# Patient Record
Sex: Female | Born: 1980 | Hispanic: No | Marital: Married | Smoking: Former smoker
Health system: Southern US, Community
[De-identification: ages and names within clinical notes are randomized; demographics above are authoritative.]

---

## 2012-06-06 ENCOUNTER — Emergency Department (HOSPITAL_BASED_OUTPATIENT_CLINIC_OR_DEPARTMENT_OTHER): Payer: Managed Care, Other (non HMO)

## 2012-06-06 ENCOUNTER — Encounter (HOSPITAL_BASED_OUTPATIENT_CLINIC_OR_DEPARTMENT_OTHER): Payer: Self-pay

## 2012-06-06 ENCOUNTER — Emergency Department (HOSPITAL_BASED_OUTPATIENT_CLINIC_OR_DEPARTMENT_OTHER)
Admission: EM | Admit: 2012-06-06 | Discharge: 2012-06-06 | Disposition: A | Discharge status: Home / Self Care | Payer: Managed Care, Other (non HMO) | Attending: Emergency Medicine | Admitting: Emergency Medicine

## 2012-06-06 DIAGNOSIS — Z331 Pregnant state, incidental: Secondary | ICD-10-CM

## 2012-06-06 DIAGNOSIS — M545 Low back pain, unspecified: Secondary | ICD-10-CM | POA: Insufficient documentation

## 2012-06-06 DIAGNOSIS — R079 Chest pain, unspecified: Secondary | ICD-10-CM | POA: Insufficient documentation

## 2012-06-06 DIAGNOSIS — O99891 Other specified diseases and conditions complicating pregnancy: Secondary | ICD-10-CM | POA: Insufficient documentation

## 2012-06-06 DIAGNOSIS — O26859 Spotting complicating pregnancy, unspecified trimester: Secondary | ICD-10-CM | POA: Insufficient documentation

## 2012-06-06 DIAGNOSIS — R109 Unspecified abdominal pain: Secondary | ICD-10-CM | POA: Insufficient documentation

## 2012-06-06 DIAGNOSIS — O210 Mild hyperemesis gravidarum: Secondary | ICD-10-CM | POA: Insufficient documentation

## 2012-06-06 LAB — CBC
Hemoglobin: 14.4 g/dL (ref 12.0–15.0)
MCH: 33.7 pg (ref 26.0–34.0)
MCHC: 36 g/dL (ref 30.0–36.0)
MCV: 93.7 fL (ref 78.0–100.0)
Platelets: 251 10*3/uL (ref 150–400)

## 2012-06-06 LAB — PREGNANCY, URINE: Preg Test, Ur: POSITIVE — AB

## 2012-06-06 LAB — URINALYSIS, ROUTINE W REFLEX MICROSCOPIC
Ketones, ur: NEGATIVE mg/dL
Leukocytes, UA: NEGATIVE
Protein, ur: NEGATIVE mg/dL
Urobilinogen, UA: 0.2 mg/dL (ref 0.0–1.0)

## 2012-06-06 LAB — ABO/RH: ABO/RH(D): B POS

## 2012-06-06 LAB — WET PREP, GENITAL: Trich, Wet Prep: NONE SEEN

## 2012-06-06 MED ORDER — PRENATAL COMPLETE 14-0.4 MG PO TABS: 1.0000 | ORAL_TABLET | Freq: Every day | ORAL | Status: AC

## 2012-06-06 NOTE — ED Notes (Signed)
C/o abd pain,lower back pain x 1 week pain to upper left back,left breast today

## 2012-06-06 NOTE — ED Notes (Addendum)
Pt states she started to have cramping in her abdomen last week and felt like she was going to start her period but nothing but spotting came. Cramping in genital area feels like when she had miscarriage in Dec 2012. Pt states today sharp pain starting in neck and left shoulder blade radiating to left breast almost caused her to leave work. Pt c/o lower back pain as well and numbness in legs. Pt c/o dizziness, diarrhea, and nausea. Pt denies vomiting and SOB.

## 2012-06-07 LAB — GC/CHLAMYDIA PROBE AMP, GENITAL
Chlamydia, DNA Probe: NEGATIVE
GC Probe Amp, Genital: NEGATIVE

## 2012-06-10 NOTE — ED Provider Notes (Signed)
History     CSN: [id number]  Arrival date & time 06/06/12  1735   First MD Initiated Contact with Patient 06/06/12 1809      Chief Complaint  Patient presents with  . Abdominal Pain    (Consider location/radiation/quality/duration/timing/severity/associated sxs/prior treatment) HPI Hx from pt. Sandra Case is a 31 y.o. female presenting with crampy lower abdominal pain x1 week and low back pain. She developed some vaginal bleeding/spotting today. She reports that her last menstrual period was July 4. Pain is described as cramping with no known aggravating or alleviating factors. It seems to radiate from the abdomen to the back. She has not taken any medication at home for this. Patient states that she was pregnant and had a spontaneous abortion in December of 2012 and her symptoms felt similar at that time. She has been nauseated. She denies any vomiting, fever, chills, changes in bowel movements, urinary symptoms. Patient states that today she had a sharp intermittent pain to the left shoulder blade which radiated to her left breast. It did not worsen with deep breaths. It was intermittent in nature. Worsens with movement. No shortness of breath associated with it.  History reviewed. No pertinent past medical history.  History reviewed. No pertinent past surgical history.  No family history on file.  History  Substance Use Topics  . Smoking status: Current Everyday Smoker  . Smokeless tobacco: Not on file  . Alcohol Use: Yes    OB History    Grav Para Term Preterm Abortions TAB SAB Ect Mult Living                  Review of Systems  Constitutional: Negative for fever, chills, activity change and appetite change.  Respiratory: Negative for cough and shortness of breath.   Cardiovascular: Positive for chest pain. Negative for palpitations and leg swelling.  Gastrointestinal: Positive for nausea and abdominal pain. Negative for vomiting, diarrhea, constipation and  abdominal distention.  Genitourinary: Positive for vaginal bleeding. Negative for dysuria and vaginal discharge.  Musculoskeletal: Negative for myalgias.  Skin: Negative for rash.  Neurological: Negative for dizziness, weakness and numbness.  All other systems reviewed and are negative.    Allergies  Review of patient's allergies indicates no known allergies.  Home Medications   Current Outpatient Rx  Name Route Sig Dispense Refill  . IBUPROFEN 200 MG PO TABS Oral Take 800 mg by mouth every 6 (six) hours as needed. For headache.    Marland Kitchen PRENATAL COMPLETE 14-0.4 MG PO TABS Oral Take 1 tablet by mouth daily. 60 each 4    BP 127/71  Pulse 85  Temp 98.6 F (37 C) (Oral)  Resp 16  Ht 5\' 7"  (1.702 m)  Wt 127 lb (57.607 kg)  BMI 19.89 kg/m2  SpO2 100%  LMP 04/26/2012  Physical Exam  Nursing note and vitals reviewed. Constitutional: She appears well-developed and well-nourished. No distress.  HENT:  Head: Normocephalic and atraumatic.  Neck: Normal range of motion.  Cardiovascular: Normal rate, regular rhythm and normal heart sounds.   Pulmonary/Chest: Effort normal and breath sounds normal. She exhibits tenderness.    Abdominal: Soft. Bowel sounds are normal.       Mild suprapubic tenderness, no rebound or guard. No CVA tenderness.  Genitourinary:       Chaperone present during exam. Cervical os is closed. No cervical motion tenderness. Small amount of brown appearing blood in the vaginal vault. No masses appreciated on bimanual exam.  Musculoskeletal: Normal range of motion.  Neurological: She is alert.  Skin: Skin is warm and dry. She is not diaphoretic.  Psychiatric: She has a normal mood and affect.    ED Course  Procedures (including critical care time)  Labs Reviewed  URINALYSIS, ROUTINE W REFLEX MICROSCOPIC - Abnormal; Notable for the following:    APPearance CLOUDY (*)     All other components within normal limits  PREGNANCY, URINE - Abnormal; Notable for the  following:    Preg Test, Ur POSITIVE (*)     All other components within normal limits  HCG, QUANTITATIVE, PREGNANCY - Abnormal; Notable for the following:    hCG, Beta Chain, Quant, S 6385 (*)     All other components within normal limits  CBC - Abnormal; Notable for the following:    RDW 11.4 (*)     All other components within normal limits  WET PREP, GENITAL - Abnormal; Notable for the following:    Clue Cells Wet Prep HPF POC FEW (*)     WBC, Wet Prep HPF POC FEW (*)     All other components within normal limits  ABO/RH  GC/CHLAMYDIA PROBE AMP, GENITAL   No results found.   1. Pregnant state, incidental       MDM  Patient presents with crampy abdominal pain and spotting. The spotting started today. Patient reports she has had a miscarriage previously. Last menstrual period was July 4. Urine hCG here is positive. Ultrasound shows intrauterine pregnancy with estimated gestation of approximately 5 weeks. On exam, the cervical os is closed. There is a small amount of blood seen. She is Rh+, so no need for RhoGAM. Lab investigation otherwise unremarkable.  Regarding the patient's chest/shoulder discomfort, her pain is not pleuritic in nature she denies any shortness of breath with this. Her vitals are stable. I do not suspect pulmonary embolus as the etiology for this as she does have reproducible pain on exam.  Patient was instructed to make a followup with OB/GYN regarding her positive pregnancy test. Prescription given for prenatal vitamins. Reasons to return to the emergency department discussed.        Grant Fontana, PA-C 06/10/12 1232

## 2012-06-10 NOTE — ED Provider Notes (Signed)
Medical screening examination/treatment/procedure(s) were performed by non-physician practitioner and as supervising physician I was immediately available for consultation/collaboration.   Charles B. Sheldon, MD 06/10/12 2152 

## 2013-01-21 ENCOUNTER — Inpatient Hospital Stay (HOSPITAL_COMMUNITY): Admission: AD | Admit: 2013-01-21 | Payer: Self-pay | Source: Ambulatory Visit | Admitting: Obstetrics and Gynecology

## 2013-02-06 IMAGING — US US OB COMP LESS 14 WK
1 series · 14 of 28 positions shown · non-contrast
Comparison: None.

CLINICAL DATA: ]  Abdominal cramping, bleeding, pregnant.

OBSTETRIC <14 WK US AND TRANSVAGINAL OB US
TECHNIQUE: Both transabdominal and transvaginal ultrasound
examinations were performed for complete evaluation of the
gestation as well as the maternal uterus, adnexal regions, and
pelvic cul-de-sac.  Transvaginal technique was performed to assess
early pregnancy.

[Series 1: us ob comp less 14 wk · 0.22mm/px · 14 of 67 slices shown]
[im 3/67]
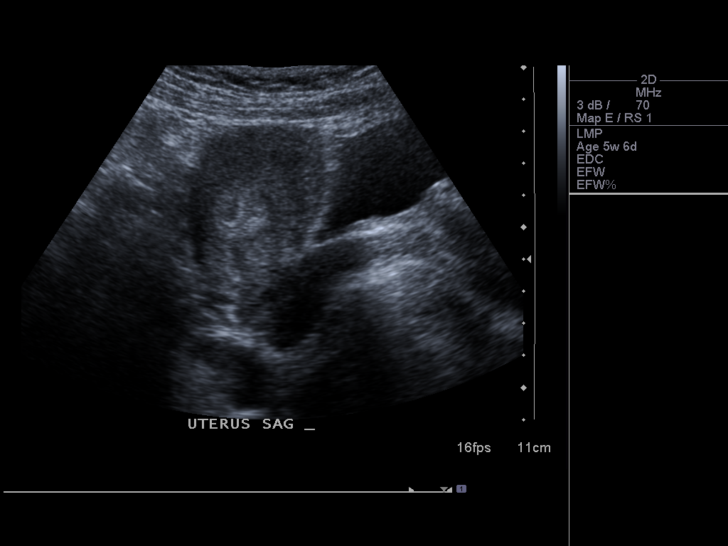
[im 8/67]
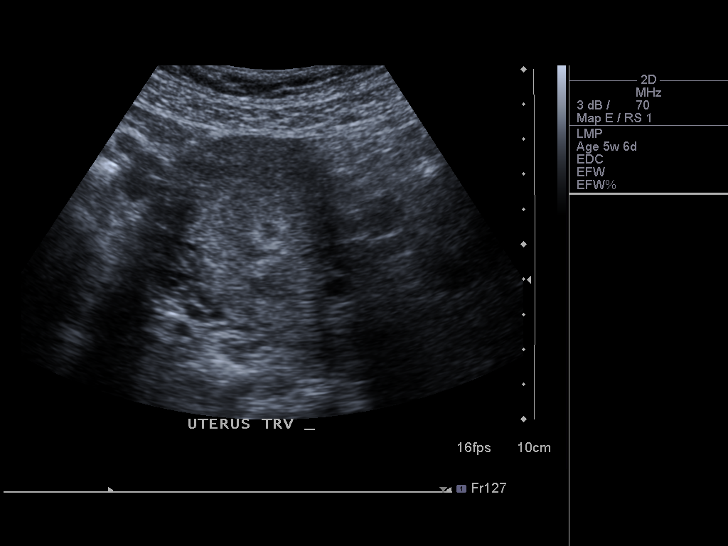
[im 13/67]
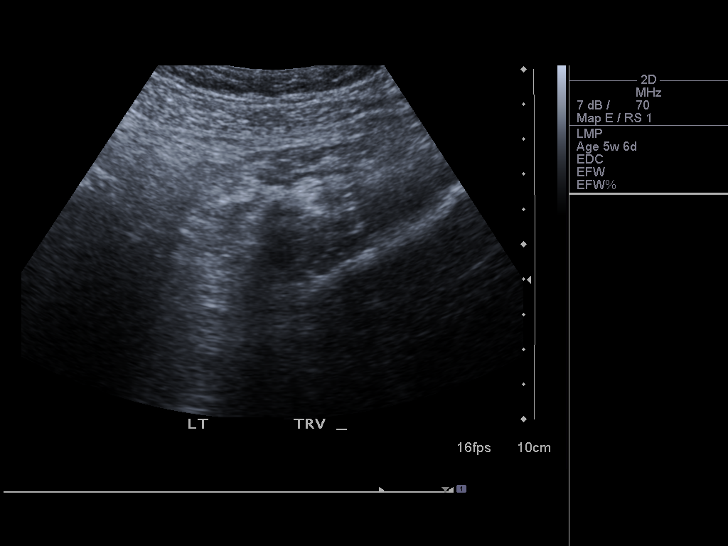
[im 18/67]
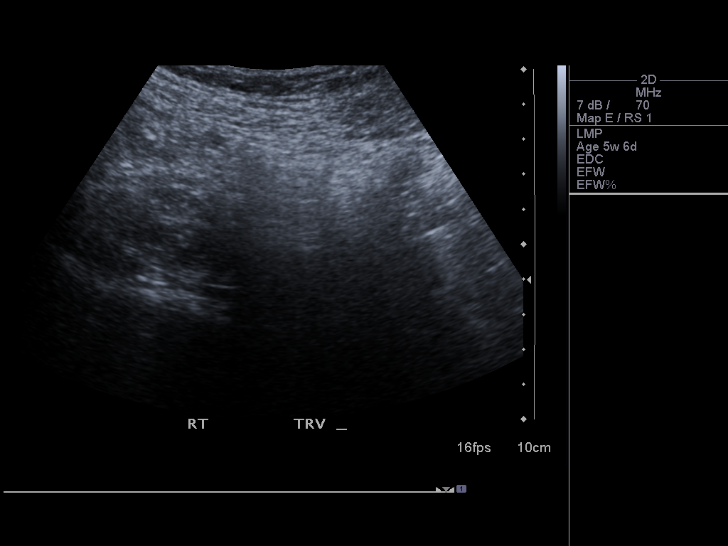
[im 23/67]
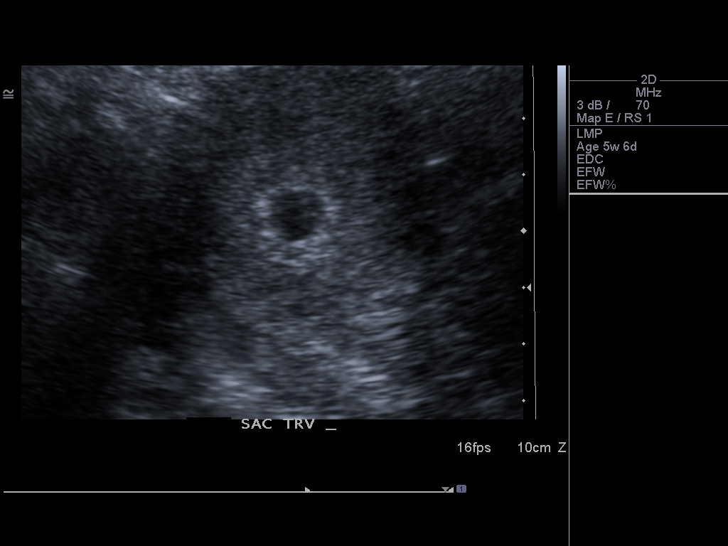
[im 27/67]
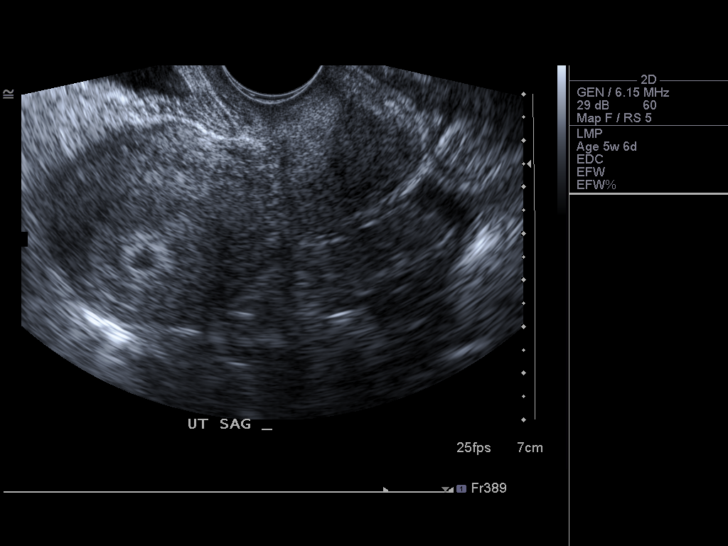
[im 32/67]
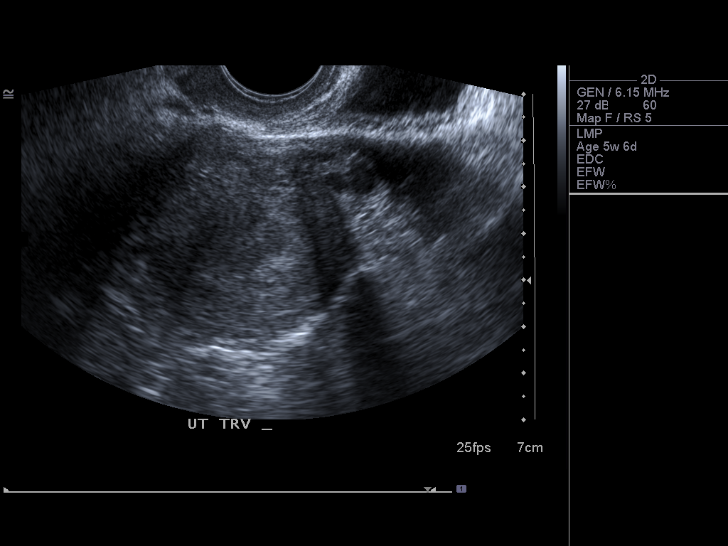
[im 37/67]
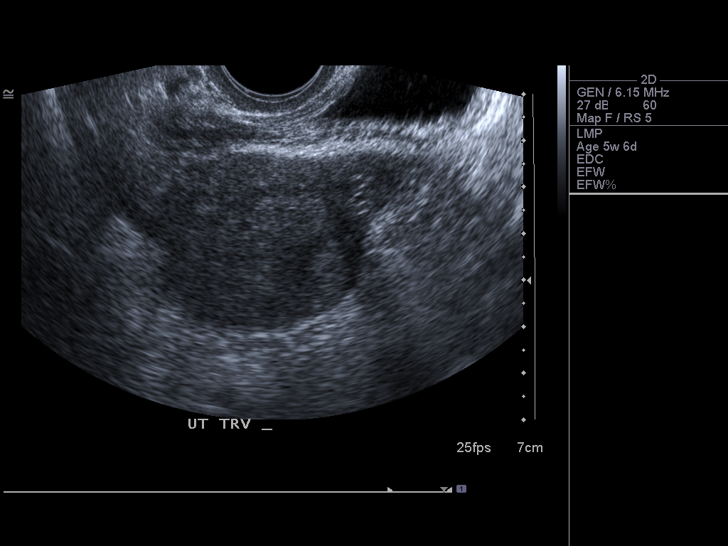
[im 42/67]
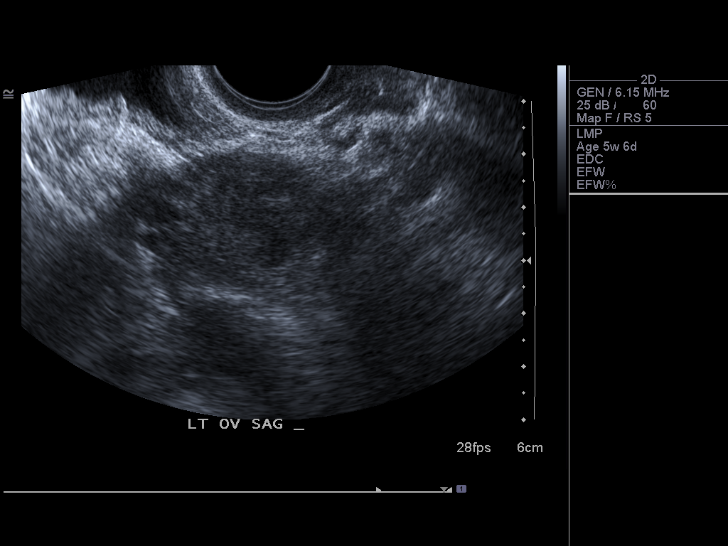
[im 47/67]
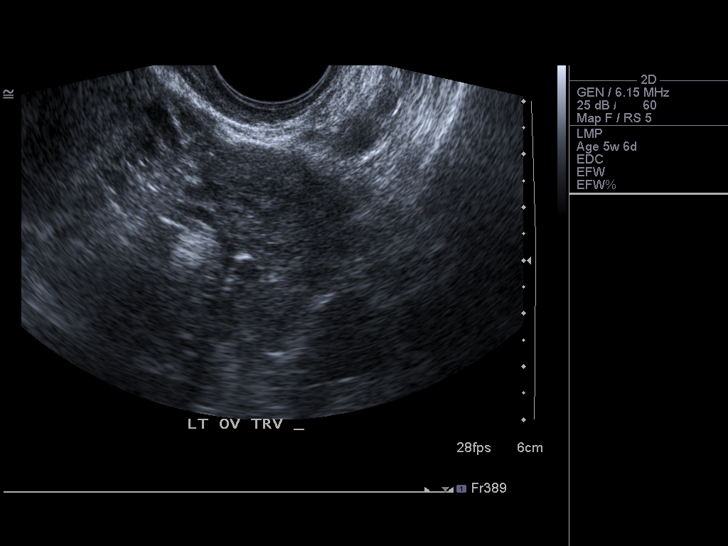
[im 52/67]
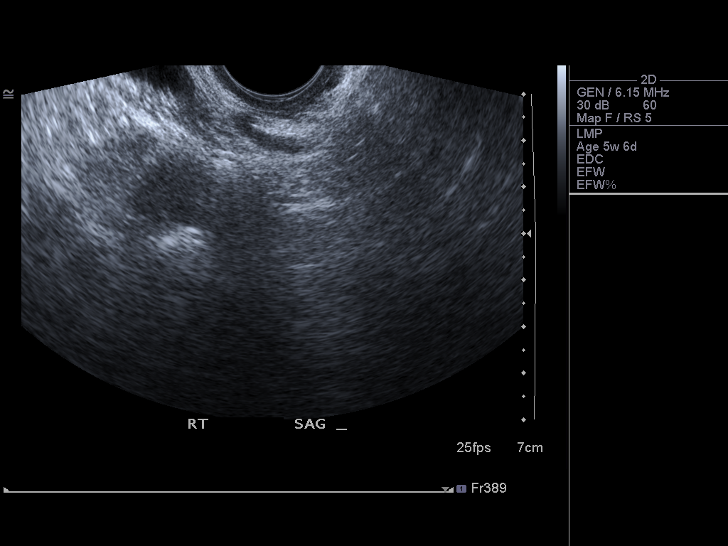
[im 57/67]
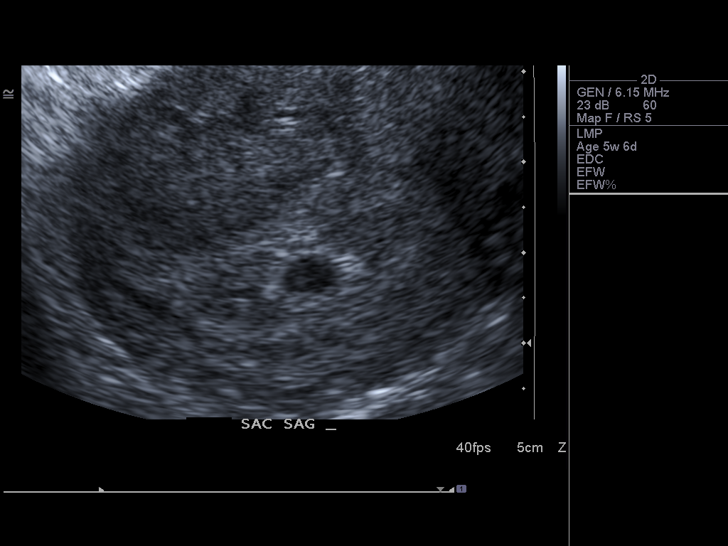
[im 62/67]
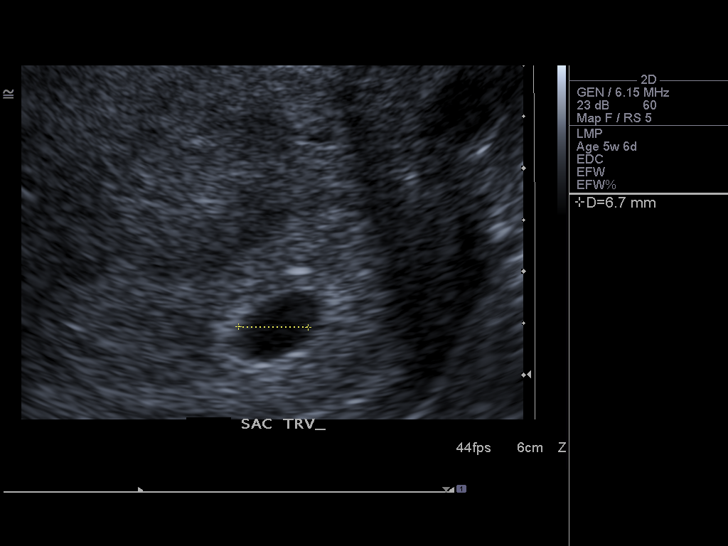
[im 67/67]
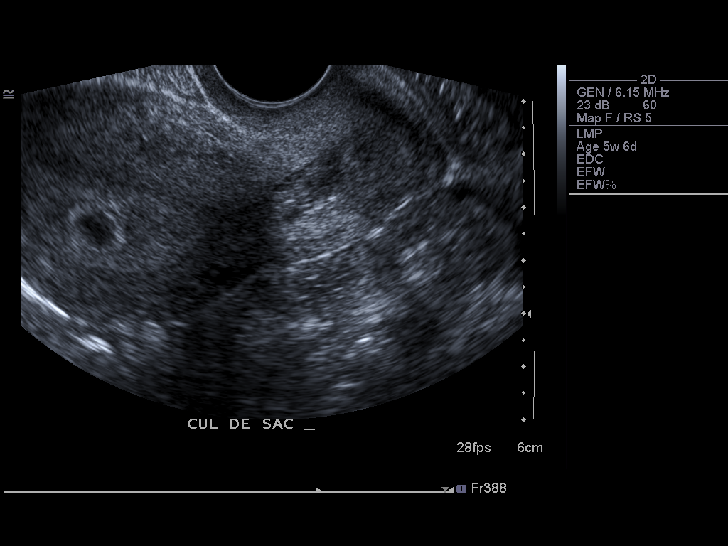

[14 of 28 positions shown; findings below may reference images not displayed]

Intrauterine gestational sac: Single.
Yolk sac: Not seen.
Embryo: Not seen.
Cardiac Activity: Not seen.
MSD: 6.2 mm  5 w  2 d
      US EDC: 02/04/13

Maternal uterus/Adnexae:
Left ovary 28 x 33 x 41 mm, unremarkable.  The right ovary was not
visualized.  No free fluid.
IMPRESSION: Early 5-week-5-day intrauterine gestation.

## 2018-12-21 ENCOUNTER — Emergency Department (HOSPITAL_COMMUNITY)
Admission: EM | Admit: 2018-12-21 | Discharge: 2018-12-21 | Disposition: A | Discharge status: Home / Self Care | Payer: BLUE CROSS/BLUE SHIELD | Attending: Emergency Medicine | Admitting: Emergency Medicine

## 2018-12-21 ENCOUNTER — Encounter (HOSPITAL_COMMUNITY): Payer: Self-pay | Admitting: Emergency Medicine

## 2018-12-21 DIAGNOSIS — Z87891 Personal history of nicotine dependence: Secondary | ICD-10-CM | POA: Insufficient documentation

## 2018-12-21 DIAGNOSIS — N939 Abnormal uterine and vaginal bleeding, unspecified: Secondary | ICD-10-CM | POA: Diagnosis not present

## 2018-12-21 LAB — I-STAT BETA HCG BLOOD, ED (MC, WL, AP ONLY): I-stat hCG, quantitative: <5 m[IU]/mL (ref <5)

## 2018-12-21 MED ORDER — KETOROLAC TROMETHAMINE 30 MG/ML IJ SOLN
30.0000 mg | Freq: Once | INTRAMUSCULAR | Status: AC
  Administered 2018-12-21: 30 mg via INTRAMUSCULAR
  Filled 2018-12-21: qty 1

## 2018-12-21 NOTE — Discharge Instructions (Addendum)
Please read attached information. If you experience any new or worsening signs or symptoms please return to the emergency room for evaluation. Please follow-up with your primary care provider or specialist as discussed.  °

## 2018-12-21 NOTE — ED Triage Notes (Signed)
Pt is concerned she may have had an early miscarriage. Pt passed a "tissue- looking" clot on Wednesday. Pt started period on time on Tuesday. Pt started having really bad abd cramping and strange sensation in her breasts. Pt said her preg test was negative.

## 2018-12-21 NOTE — ED Provider Notes (Signed)
[facility] EMERGENCY DEPARTMENT Provider Note   CSN: [id number] Arrival date & time: 12/21/18  1322   History   Chief Complaint Chief Complaint  Patient presents with  . Abdominal Cramping  . Miscarriage    possible    HPI Sandra Case is a 38 y.o. female.     HPI     38 year old female presents today with complaints of questionable pregnancy.  Patient notes her menstrual cycle started on Tuesday as it regularly does.  She notes prior to that she felt as if she was pregnant she had generalized headache and some pelvic cramping.  She notes she had several blood clots and what appeared to be tissue.  She notes vague pelvic pulling sensation now.  She notes the bleeding is very light and typical of menstruation.  She denies any vaginal discharge.  She notes recently she was seen at her workplace employment and diagnosed with a urinary tract infection.  She was placed on Bactrim at that time.  She notes she has had a viral GI illness and was seen on the 19th of this month.  She did not have urinary symptoms and did not have signs of urinary tract infection at that time.  She was called back several days later and told that her urine grew out E. coli and was started on Macrobid.  She notes she has been taking this for 5 days but it makes her feel ill.  She denies any urinary symptoms.  Patient notes a generalized headache with no neurological deficits.  History reviewed. No pertinent past medical history.  There are no active problems to display for this patient.   History reviewed. No pertinent surgical history.   OB History   No obstetric history on file.      Home Medications    Prior to Admission medications   Medication Sig Start Date End Date Taking? Authorizing Provider  ibuprofen (ADVIL,MOTRIN) 200 MG tablet Take 800 mg by mouth every 6 (six) hours as needed. For headache.    [provider]  Prenatal Vit-Fe Fumarate-FA (PRENATAL  COMPLETE) 14-0.4 MG TABS Take 1 tablet by mouth daily. 06/06/12   Grant Fontana, PA-C    Family History History reviewed. No pertinent family history.  Social History Social History   Tobacco Use  . Smoking status: Former Games developer  . Smokeless tobacco: Never Used  Substance Use Topics  . Alcohol use: Yes    Comment: wine  . Drug use: Yes    Types: Marijuana     Allergies   Patient has no known allergies.   Review of Systems Review of Systems  All other systems reviewed and are negative.    Physical Exam Updated Vital Signs BP (!) 102/59 (BP Location: Right Arm)   Pulse 64   Temp 98.3 F (36.8 C) (Oral)   Resp 16   LMP 12/19/2018   SpO2 100%   Physical Exam Vitals signs and nursing note reviewed.  Constitutional:      Appearance: She is well-developed.  HENT:     Head: Normocephalic and atraumatic.  Eyes:     General: No scleral icterus.       Right eye: No discharge.        Left eye: No discharge.     Conjunctiva/sclera: Conjunctivae normal.     Pupils: Pupils are equal, round, and reactive to light.  Neck:     Musculoskeletal: Normal range of motion.     Vascular: No JVD.  Trachea: No tracheal deviation.  Pulmonary:     Effort: Pulmonary effort is normal.     Breath sounds: No stridor.  Abdominal:     General: Abdomen is flat. There is no distension.     Palpations: Abdomen is soft. There is no mass.     Tenderness: There is no abdominal tenderness. There is no guarding.     Hernia: No hernia is present.  Neurological:     Mental Status: She is alert and oriented to person, place, and time.     Coordination: Coordination normal.  Psychiatric:        Behavior: Behavior normal.        Thought Content: Thought content normal.        Judgment: Judgment normal.      ED Treatments / Results  Labs (all labs ordered are listed, but only abnormal results are displayed) Labs Reviewed  I-STAT BETA HCG BLOOD, ED ([facility], [facility], [facility] ONLY)     EKG None  Radiology No results found.  Procedures Procedures (including critical care time)  Medications Ordered in ED Medications  ketorolac (TORADOL) 30 MG/ML injection 30 mg (30 mg Intramuscular Given 12/21/18 1444)     Initial Impression / Assessment and Plan / ED Course  I have reviewed the triage vital signs and the nursing notes.  Pertinent labs & imaging results that were available during my care of the patient were reviewed by me and considered in my medical decision making (see chart for details).        Labs: I-STAT beta-hCG  Imaging:  Consults:  Therapeutics: Toradol  Discharge Meds:   Assessment/Plan: 38 year old female presents today with pelvic cramping in the setting of her menstrual cycle.  Patient has no unilateral symptoms, no severe symptoms, she is not pregnant and is not having severe bleeding.  She denies any vaginal discharge.  Is not having any urinary symptoms.  I have struck patient to rest, use Tylenol or ibuprofen as needed for discomfort return immediately if symptoms worsen, follow-up with her OB/GYN if symptoms persist.  She verbalized understanding and agreement to today's plan.      Final Clinical Impressions(s) / ED Diagnoses   Final diagnoses:  Abnormal uterine bleeding    ED Discharge Orders    None       Rosalio Loud 12/21/18 1517    Tegeler, Canary Brim, MD 12/21/18 1919
